# Patient Record
Sex: Female | Born: 1980 | Race: White | Hispanic: No | Marital: Married | Smoking: Never smoker
Health system: Southern US, Community
[De-identification: ages and names within clinical notes are randomized; demographics above are authoritative.]

---

## 2009-08-03 ENCOUNTER — Ambulatory Visit (HOSPITAL_COMMUNITY): Admission: AD | Admit: 2009-08-03 | Discharge: 2009-08-03 | Payer: Self-pay | Admitting: Obstetrics and Gynecology

## 2009-08-26 ENCOUNTER — Inpatient Hospital Stay (HOSPITAL_COMMUNITY): Admission: AD | Admit: 2009-08-26 | Discharge: 2009-08-27 | Payer: Self-pay | Admitting: Obstetrics and Gynecology

## 2009-10-30 ENCOUNTER — Inpatient Hospital Stay (HOSPITAL_COMMUNITY): Admission: AD | Admit: 2009-10-30 | Discharge: 2009-10-30 | Payer: Self-pay | Admitting: Obstetrics and Gynecology

## 2009-11-03 ENCOUNTER — Inpatient Hospital Stay (HOSPITAL_COMMUNITY): Admission: AD | Admit: 2009-11-03 | Discharge: 2009-11-07 | Payer: Self-pay | Admitting: Obstetrics and Gynecology

## 2010-12-03 LAB — CBC
HCT: 22.9 % — ABNORMAL LOW (ref 36.0–46.0)
HCT: 35.7 % — ABNORMAL LOW (ref 36.0–46.0)
Hemoglobin: 11.8 g/dL — ABNORMAL LOW (ref 12.0–15.0)
Hemoglobin: 7.6 g/dL — ABNORMAL LOW (ref 12.0–15.0)
MCHC: 33.1 g/dL (ref 30.0–36.0)
MCHC: 33.4 g/dL (ref 30.0–36.0)
MCV: 82.9 fL (ref 78.0–100.0)
MCV: 82.9 fL (ref 78.0–100.0)
RBC: 4.31 MIL/uL (ref 3.87–5.11)
RDW: 12.7 % (ref 11.5–15.5)
WBC: 12.2 10*3/uL — ABNORMAL HIGH (ref 4.0–10.5)

## 2010-12-03 LAB — RH IMMUNE GLOB WKUP(>/=20WKS)(NOT WOMEN'S HOSP)

## 2010-12-15 LAB — WET PREP, GENITAL: Yeast Wet Prep HPF POC: NONE SEEN

## 2010-12-15 LAB — URINALYSIS, ROUTINE W REFLEX MICROSCOPIC
Ketones, ur: NEGATIVE mg/dL
Nitrite: NEGATIVE
Specific Gravity, Urine: 1.02 (ref 1.005–1.030)
pH: 6.5 (ref 5.0–8.0)

## 2010-12-15 LAB — URINE MICROSCOPIC-ADD ON

## 2010-12-15 LAB — URINE CULTURE

## 2010-12-15 LAB — FETAL FIBRONECTIN: Fetal Fibronectin: NEGATIVE

## 2010-12-16 LAB — GLUCOSE TOLERANCE, 1 HOUR: Glucose, 1 Hour GTT: 83 mg/dL (ref 70–140)

## 2010-12-16 LAB — RH IMMUNE GLOBULIN WORKUP (NOT WOMEN'S HOSP)

## 2010-12-16 LAB — RPR: RPR Ser Ql: NONREACTIVE

## 2013-12-20 LAB — OB RESULTS CONSOLE ANTIBODY SCREEN: Antibody Screen: NEGATIVE

## 2013-12-20 LAB — OB RESULTS CONSOLE HIV ANTIBODY (ROUTINE TESTING): HIV: NONREACTIVE

## 2013-12-20 LAB — OB RESULTS CONSOLE ABO/RH: RH TYPE: NEGATIVE

## 2013-12-20 LAB — OB RESULTS CONSOLE RUBELLA ANTIBODY, IGM: Rubella: IMMUNE

## 2013-12-20 LAB — OB RESULTS CONSOLE HEPATITIS B SURFACE ANTIGEN: Hepatitis B Surface Ag: NEGATIVE

## 2013-12-20 LAB — OB RESULTS CONSOLE RPR: RPR: NONREACTIVE

## 2013-12-20 LAB — OB RESULTS CONSOLE GC/CHLAMYDIA
CHLAMYDIA, DNA PROBE: NEGATIVE
Gonorrhea: NEGATIVE

## 2014-04-04 ENCOUNTER — Inpatient Hospital Stay (HOSPITAL_COMMUNITY): Admission: AD | Admit: 2014-04-04 | Payer: Self-pay | Source: Ambulatory Visit | Admitting: Obstetrics and Gynecology

## 2014-07-26 LAB — OB RESULTS CONSOLE GBS: STREP GROUP B AG: NEGATIVE

## 2014-08-27 ENCOUNTER — Encounter (HOSPITAL_COMMUNITY): Payer: Self-pay | Admitting: *Deleted

## 2014-08-27 ENCOUNTER — Inpatient Hospital Stay (HOSPITAL_COMMUNITY): Payer: BC Managed Care – PPO | Admitting: Anesthesiology

## 2014-08-27 ENCOUNTER — Encounter (HOSPITAL_COMMUNITY)
Admission: AD | Disposition: A | Discharge status: Home / Self Care | Payer: Self-pay | Source: Ambulatory Visit | Attending: Obstetrics and Gynecology

## 2014-08-27 ENCOUNTER — Inpatient Hospital Stay (HOSPITAL_COMMUNITY)
Admission: AD | Admit: 2014-08-27 | Discharge: 2014-08-29 | DRG: 766 | Disposition: A | Discharge status: Home / Self Care | Payer: BC Managed Care – PPO | Source: Ambulatory Visit | Attending: Obstetrics and Gynecology | Admitting: Obstetrics and Gynecology

## 2014-08-27 DIAGNOSIS — O26899 Other specified pregnancy related conditions, unspecified trimester: Secondary | ICD-10-CM

## 2014-08-27 DIAGNOSIS — Z3A41 41 weeks gestation of pregnancy: Secondary | ICD-10-CM | POA: Diagnosis present

## 2014-08-27 DIAGNOSIS — Z3483 Encounter for supervision of other normal pregnancy, third trimester: Secondary | ICD-10-CM | POA: Diagnosis present

## 2014-08-27 DIAGNOSIS — D649 Anemia, unspecified: Secondary | ICD-10-CM | POA: Diagnosis present

## 2014-08-27 DIAGNOSIS — O9902 Anemia complicating childbirth: Secondary | ICD-10-CM | POA: Diagnosis present

## 2014-08-27 DIAGNOSIS — O3660X0 Maternal care for excessive fetal growth, unspecified trimester, not applicable or unspecified: Secondary | ICD-10-CM

## 2014-08-27 DIAGNOSIS — Z98891 History of uterine scar from previous surgery: Secondary | ICD-10-CM

## 2014-08-27 DIAGNOSIS — O480 Post-term pregnancy: Secondary | ICD-10-CM | POA: Diagnosis present

## 2014-08-27 DIAGNOSIS — O3663X0 Maternal care for excessive fetal growth, third trimester, not applicable or unspecified: Secondary | ICD-10-CM | POA: Diagnosis present

## 2014-08-27 DIAGNOSIS — Z6791 Unspecified blood type, Rh negative: Secondary | ICD-10-CM

## 2014-08-27 DIAGNOSIS — O3421 Maternal care for scar from previous cesarean delivery: Secondary | ICD-10-CM | POA: Diagnosis present

## 2014-08-27 DIAGNOSIS — F411 Generalized anxiety disorder: Secondary | ICD-10-CM

## 2014-08-27 LAB — RPR

## 2014-08-27 LAB — CBC
HCT: 37.6 % (ref 36.0–46.0)
Hemoglobin: 12.9 g/dL (ref 12.0–15.0)
MCH: 28.7 pg (ref 26.0–34.0)
MCHC: 34.3 g/dL (ref 30.0–36.0)
MCV: 83.7 fL (ref 78.0–100.0)
PLATELETS: 148 10*3/uL — AB (ref 150–400)
RBC: 4.49 MIL/uL (ref 3.87–5.11)
RDW: 14.2 % (ref 11.5–15.5)
WBC: 11.3 10*3/uL — ABNORMAL HIGH (ref 4.0–10.5)

## 2014-08-27 SURGERY — Surgical Case
Anesthesia: Epidural | Site: Abdomen

## 2014-08-27 MED ORDER — KETOROLAC TROMETHAMINE 30 MG/ML IJ SOLN: 30.0000 mg | Freq: Four times a day (QID) | INTRAMUSCULAR | Status: AC | PRN

## 2014-08-27 MED ORDER — WITCH HAZEL-GLYCERIN EX PADS: 1.0000 "application " | MEDICATED_PAD | CUTANEOUS | Status: DC | PRN

## 2014-08-27 MED ORDER — DIBUCAINE 1 % RE OINT: 1.0000 "application " | TOPICAL_OINTMENT | RECTAL | Status: DC | PRN

## 2014-08-27 MED ORDER — PHENYLEPHRINE 40 MCG/ML (10ML) SYRINGE FOR IV PUSH (FOR BLOOD PRESSURE SUPPORT): 80.0000 ug | PREFILLED_SYRINGE | INTRAVENOUS | Status: DC | PRN

## 2014-08-27 MED ORDER — MEPERIDINE HCL 25 MG/ML IJ SOLN: 6.2500 mg | INTRAMUSCULAR | Status: DC | PRN

## 2014-08-27 MED ORDER — OXYCODONE-ACETAMINOPHEN 5-325 MG PO TABS: 2.0000 | ORAL_TABLET | ORAL | Status: DC | PRN

## 2014-08-27 MED ORDER — NALOXONE HCL 1 MG/ML IJ SOLN: 1.0000 ug/kg/h | INTRAVENOUS | Status: DC | PRN

## 2014-08-27 MED ORDER — SODIUM BICARBONATE 8.4 % IV SOLN
INTRAVENOUS | Status: DC | PRN
  Administered 2014-08-27 (×4): 5 mL via EPIDURAL

## 2014-08-27 MED ORDER — EPHEDRINE 5 MG/ML INJ: 10.0000 mg | INTRAVENOUS | Status: DC | PRN

## 2014-08-27 MED ORDER — PRENATAL MULTIVITAMIN CH
1.0000 | ORAL_TABLET | Freq: Every day | ORAL | Status: DC
  Administered 2014-08-28 – 2014-08-29 (×2): 1 via ORAL
  Filled 2014-08-27 (×2): qty 1

## 2014-08-27 MED ORDER — FERROUS SULFATE 325 (65 FE) MG PO TABS
325.0000 mg | ORAL_TABLET | Freq: Two times a day (BID) | ORAL | Status: DC
  Administered 2014-08-28 – 2014-08-29 (×3): 325 mg via ORAL
  Filled 2014-08-27 (×4): qty 1

## 2014-08-27 MED ORDER — OXYTOCIN 40 UNITS IN LACTATED RINGERS INFUSION - SIMPLE MED: 62.5000 mL/h | INTRAVENOUS | Status: DC

## 2014-08-27 MED ORDER — PHENYLEPHRINE 40 MCG/ML (10ML) SYRINGE FOR IV PUSH (FOR BLOOD PRESSURE SUPPORT)
80.0000 ug | PREFILLED_SYRINGE | INTRAVENOUS | Status: DC | PRN
  Filled 2014-08-27: qty 10

## 2014-08-27 MED ORDER — LACTATED RINGERS IV SOLN
INTRAVENOUS | Status: DC
  Administered 2014-08-27: 23:00:00 via INTRAVENOUS

## 2014-08-27 MED ORDER — ONDANSETRON HCL 4 MG/2ML IJ SOLN
4.0000 mg | Freq: Three times a day (TID) | INTRAMUSCULAR | Status: DC | PRN
  Administered 2014-08-27: 4 mg via INTRAVENOUS

## 2014-08-27 MED ORDER — IBUPROFEN 600 MG PO TABS
600.0000 mg | ORAL_TABLET | Freq: Four times a day (QID) | ORAL | Status: DC
  Administered 2014-08-27 – 2014-08-29 (×7): 600 mg via ORAL
  Filled 2014-08-27 (×7): qty 1

## 2014-08-27 MED ORDER — SCOPOLAMINE 1 MG/3DAYS TD PT72: 1.0000 | MEDICATED_PATCH | Freq: Once | TRANSDERMAL | Status: DC

## 2014-08-27 MED ORDER — METOCLOPRAMIDE HCL 5 MG/ML IJ SOLN
INTRAMUSCULAR | Status: DC | PRN
  Administered 2014-08-27 (×2): 5 mg via INTRAVENOUS

## 2014-08-27 MED ORDER — FENTANYL CITRATE 0.05 MG/ML IJ SOLN: 25.0000 ug | INTRAMUSCULAR | Status: DC | PRN

## 2014-08-27 MED ORDER — NALBUPHINE HCL 10 MG/ML IJ SOLN: 5.0000 mg | INTRAMUSCULAR | Status: DC | PRN

## 2014-08-27 MED ORDER — CEFAZOLIN SODIUM-DEXTROSE 2-3 GM-% IV SOLR
2.0000 g | Freq: Once | INTRAVENOUS | Status: AC
  Administered 2014-08-27: 2 g via INTRAVENOUS
  Filled 2014-08-27: qty 50

## 2014-08-27 MED ORDER — PHENYLEPHRINE 8 MG IN D5W 100 ML (0.08MG/ML) PREMIX OPTIME
INJECTION | INTRAVENOUS | Status: DC | PRN
  Administered 2014-08-27: 60 ug/min via INTRAVENOUS

## 2014-08-27 MED ORDER — MENTHOL 3 MG MT LOZG: 1.0000 | LOZENGE | OROMUCOSAL | Status: DC | PRN

## 2014-08-27 MED ORDER — LIDOCAINE-EPINEPHRINE (PF) 2 %-1:200000 IJ SOLN
INTRAMUSCULAR | Status: AC
  Filled 2014-08-27: qty 20

## 2014-08-27 MED ORDER — SODIUM BICARBONATE 8.4 % IV SOLN
INTRAVENOUS | Status: AC
  Filled 2014-08-27: qty 50

## 2014-08-27 MED ORDER — ONDANSETRON HCL 4 MG/2ML IJ SOLN
INTRAMUSCULAR | Status: DC | PRN
  Administered 2014-08-27: 4 mg via INTRAVENOUS

## 2014-08-27 MED ORDER — NALBUPHINE HCL 10 MG/ML IJ SOLN: 5.0000 mg | Freq: Once | INTRAMUSCULAR | Status: AC | PRN

## 2014-08-27 MED ORDER — ZOLPIDEM TARTRATE 5 MG PO TABS: 5.0000 mg | ORAL_TABLET | Freq: Every evening | ORAL | Status: DC | PRN

## 2014-08-27 MED ORDER — MEASLES, MUMPS & RUBELLA VAC ~~LOC~~ INJ: 0.5000 mL | INJECTION | Freq: Once | SUBCUTANEOUS | Status: DC

## 2014-08-27 MED ORDER — SENNOSIDES-DOCUSATE SODIUM 8.6-50 MG PO TABS
2.0000 | ORAL_TABLET | ORAL | Status: DC
  Administered 2014-08-27 – 2014-08-29 (×2): 2 via ORAL
  Filled 2014-08-27 (×2): qty 2

## 2014-08-27 MED ORDER — OXYTOCIN 40 UNITS IN LACTATED RINGERS INFUSION - SIMPLE MED: 62.5000 mL/h | INTRAVENOUS | Status: AC

## 2014-08-27 MED ORDER — LIDOCAINE HCL (PF) 1 % IJ SOLN: 30.0000 mL | INTRAMUSCULAR | Status: DC | PRN

## 2014-08-27 MED ORDER — FENTANYL 2.5 MCG/ML BUPIVACAINE 1/10 % EPIDURAL INFUSION (WH - ANES)
INTRAMUSCULAR | Status: DC | PRN
  Administered 2014-08-27: 15 mL/h via EPIDURAL

## 2014-08-27 MED ORDER — LIDOCAINE HCL (PF) 1 % IJ SOLN
INTRAMUSCULAR | Status: DC | PRN
  Administered 2014-08-27: 5 mL
  Administered 2014-08-27: 4 mL

## 2014-08-27 MED ORDER — PROMETHAZINE HCL 25 MG/ML IJ SOLN
12.5000 mg | Freq: Three times a day (TID) | INTRAMUSCULAR | Status: DC | PRN
  Administered 2014-08-27: 12.5 mg via INTRAVENOUS
  Filled 2014-08-27: qty 1

## 2014-08-27 MED ORDER — NALOXONE HCL 0.4 MG/ML IJ SOLN: 0.4000 mg | INTRAMUSCULAR | Status: DC | PRN

## 2014-08-27 MED ORDER — METHYLERGONOVINE MALEATE 0.2 MG PO TABS: 0.2000 mg | ORAL_TABLET | ORAL | Status: DC | PRN

## 2014-08-27 MED ORDER — OXYTOCIN 40 UNITS IN LACTATED RINGERS INFUSION - SIMPLE MED
INTRAVENOUS | Status: AC
  Filled 2014-08-27: qty 1000

## 2014-08-27 MED ORDER — SCOPOLAMINE 1 MG/3DAYS TD PT72
MEDICATED_PATCH | TRANSDERMAL | Status: DC | PRN
  Administered 2014-08-27: 1 via TRANSDERMAL

## 2014-08-27 MED ORDER — OXYCODONE-ACETAMINOPHEN 5-325 MG PO TABS
1.0000 | ORAL_TABLET | ORAL | Status: DC | PRN
Start: 2014-08-27 — End: 2014-08-29

## 2014-08-27 MED ORDER — SODIUM CHLORIDE 0.9 % IJ SOLN: 3.0000 mL | INTRAMUSCULAR | Status: DC | PRN

## 2014-08-27 MED ORDER — ONDANSETRON HCL 4 MG/2ML IJ SOLN
4.0000 mg | INTRAMUSCULAR | Status: DC | PRN
  Filled 2014-08-27: qty 2

## 2014-08-27 MED ORDER — DIPHENHYDRAMINE HCL 50 MG/ML IJ SOLN: 12.5000 mg | INTRAMUSCULAR | Status: DC | PRN

## 2014-08-27 MED ORDER — BUPIVACAINE HCL (PF) 0.25 % IJ SOLN
INTRAMUSCULAR | Status: AC
Start: 2014-08-27 — End: 2014-08-27
  Filled 2014-08-27: qty 10

## 2014-08-27 MED ORDER — OXYTOCIN 10 UNIT/ML IJ SOLN
40.0000 [IU] | INTRAVENOUS | Status: DC | PRN
  Administered 2014-08-27: 40 [IU] via INTRAVENOUS

## 2014-08-27 MED ORDER — LANOLIN HYDROUS EX OINT: 1.0000 "application " | TOPICAL_OINTMENT | CUTANEOUS | Status: DC | PRN

## 2014-08-27 MED ORDER — ONDANSETRON HCL 4 MG/2ML IJ SOLN: 4.0000 mg | Freq: Four times a day (QID) | INTRAMUSCULAR | Status: DC | PRN

## 2014-08-27 MED ORDER — TETANUS-DIPHTH-ACELL PERTUSSIS 5-2.5-18.5 LF-MCG/0.5 IM SUSP: 0.5000 mL | Freq: Once | INTRAMUSCULAR | Status: DC

## 2014-08-27 MED ORDER — LIDOCAINE HCL (PF) 1 % IJ SOLN
INTRAMUSCULAR | Status: AC
  Filled 2014-08-27: qty 30

## 2014-08-27 MED ORDER — BUPIVACAINE HCL (PF) 0.25 % IJ SOLN
INTRAMUSCULAR | Status: AC
  Filled 2014-08-27: qty 30

## 2014-08-27 MED ORDER — OXYCODONE-ACETAMINOPHEN 5-325 MG PO TABS: 1.0000 | ORAL_TABLET | ORAL | Status: DC | PRN

## 2014-08-27 MED ORDER — ONDANSETRON HCL 4 MG PO TABS: 4.0000 mg | ORAL_TABLET | ORAL | Status: DC | PRN

## 2014-08-27 MED ORDER — MORPHINE SULFATE (PF) 0.5 MG/ML IJ SOLN
INTRAMUSCULAR | Status: DC | PRN
  Administered 2014-08-27: 4 mg via EPIDURAL
  Administered 2014-08-27: 1 mg via INTRAVENOUS

## 2014-08-27 MED ORDER — LACTATED RINGERS IV SOLN: 500.0000 mL | INTRAVENOUS | Status: DC | PRN

## 2014-08-27 MED ORDER — ACETAMINOPHEN 325 MG PO TABS: 650.0000 mg | ORAL_TABLET | ORAL | Status: DC | PRN

## 2014-08-27 MED ORDER — SIMETHICONE 80 MG PO CHEW
80.0000 mg | CHEWABLE_TABLET | ORAL | Status: DC
  Administered 2014-08-27 – 2014-08-29 (×2): 80 mg via ORAL
  Filled 2014-08-27 (×2): qty 1

## 2014-08-27 MED ORDER — LACTATED RINGERS IV SOLN
500.0000 mL | Freq: Once | INTRAVENOUS | Status: AC
  Administered 2014-08-27: 500 mL via INTRAVENOUS

## 2014-08-27 MED ORDER — CITRIC ACID-SODIUM CITRATE 334-500 MG/5ML PO SOLN
30.0000 mL | ORAL | Status: DC | PRN
  Administered 2014-08-27: 30 mL via ORAL
  Filled 2014-08-27: qty 15

## 2014-08-27 MED ORDER — KETOROLAC TROMETHAMINE 30 MG/ML IJ SOLN: 15.0000 mg | Freq: Once | INTRAMUSCULAR | Status: DC | PRN

## 2014-08-27 MED ORDER — OXYTOCIN BOLUS FROM INFUSION: 500.0000 mL | INTRAVENOUS | Status: DC

## 2014-08-27 MED ORDER — DIPHENHYDRAMINE HCL 25 MG PO CAPS
25.0000 mg | ORAL_CAPSULE | ORAL | Status: DC | PRN
  Filled 2014-08-27: qty 1

## 2014-08-27 MED ORDER — DIPHENHYDRAMINE HCL 25 MG PO CAPS: 25.0000 mg | ORAL_CAPSULE | Freq: Four times a day (QID) | ORAL | Status: DC | PRN

## 2014-08-27 MED ORDER — METHYLERGONOVINE MALEATE 0.2 MG/ML IJ SOLN: 0.2000 mg | INTRAMUSCULAR | Status: DC | PRN

## 2014-08-27 MED ORDER — SIMETHICONE 80 MG PO CHEW
80.0000 mg | CHEWABLE_TABLET | ORAL | Status: DC | PRN
  Administered 2014-08-28: 80 mg via ORAL

## 2014-08-27 MED ORDER — FENTANYL 2.5 MCG/ML BUPIVACAINE 1/10 % EPIDURAL INFUSION (WH - ANES)
14.0000 mL/h | INTRAMUSCULAR | Status: DC | PRN
  Filled 2014-08-27: qty 125

## 2014-08-27 MED ORDER — FLEET ENEMA 7-19 GM/118ML RE ENEM: 1.0000 | ENEMA | RECTAL | Status: DC | PRN

## 2014-08-27 MED ORDER — OXYTOCIN 10 UNIT/ML IJ SOLN
INTRAMUSCULAR | Status: AC
  Filled 2014-08-27: qty 1

## 2014-08-27 MED ORDER — LACTATED RINGERS IV SOLN
INTRAVENOUS | Status: DC | PRN
  Administered 2014-08-27 (×2): via INTRAVENOUS

## 2014-08-27 MED ORDER — LACTATED RINGERS IV BOLUS (SEPSIS)
500.0000 mL | Freq: Once | INTRAVENOUS | Status: AC
  Administered 2014-08-27: 500 mL via INTRAVENOUS

## 2014-08-27 MED ORDER — LACTATED RINGERS IV SOLN
INTRAVENOUS | Status: DC
Start: 2014-08-27 — End: 2014-08-27
  Administered 2014-08-27: 12:00:00 via INTRAVENOUS

## 2014-08-27 MED ORDER — OXYTOCIN 10 UNIT/ML IJ SOLN
INTRAMUSCULAR | Status: AC
  Filled 2014-08-27: qty 4

## 2014-08-27 MED ORDER — CEFAZOLIN SODIUM 1-5 GM-% IV SOLN: 1.0000 g | Freq: Once | INTRAVENOUS | Status: DC

## 2014-08-27 MED ORDER — 0.9 % SODIUM CHLORIDE (POUR BTL) OPTIME
TOPICAL | Status: DC | PRN
  Administered 2014-08-27: 1000 mL

## 2014-08-27 MED ORDER — SIMETHICONE 80 MG PO CHEW
80.0000 mg | CHEWABLE_TABLET | Freq: Three times a day (TID) | ORAL | Status: DC
  Administered 2014-08-28 – 2014-08-29 (×3): 80 mg via ORAL
  Filled 2014-08-27 (×4): qty 1

## 2014-08-27 MED ORDER — LACTATED RINGERS IV SOLN
INTRAVENOUS | Status: DC | PRN
  Administered 2014-08-27: 14:00:00 via INTRAVENOUS

## 2014-08-27 MED ORDER — ONDANSETRON HCL 4 MG/2ML IJ SOLN
INTRAMUSCULAR | Status: AC
  Filled 2014-08-27: qty 2

## 2014-08-27 MED ORDER — MORPHINE SULFATE 0.5 MG/ML IJ SOLN
INTRAMUSCULAR | Status: AC
  Filled 2014-08-27: qty 10

## 2014-08-27 MED ORDER — PROMETHAZINE HCL 25 MG/ML IJ SOLN: 6.2500 mg | INTRAMUSCULAR | Status: DC | PRN

## 2014-08-27 MED ORDER — BUPIVACAINE HCL (PF) 0.25 % IJ SOLN
INTRAMUSCULAR | Status: DC | PRN
  Administered 2014-08-27: 20 mL

## 2014-08-27 SURGICAL SUPPLY — 38 items
BENZOIN TINCTURE PRP APPL 2/3 (GAUZE/BANDAGES/DRESSINGS) ×3 IMPLANT
BOOTIES KNEE HIGH SLOAN (MISCELLANEOUS) ×6 IMPLANT
CLAMP CORD UMBIL (MISCELLANEOUS) IMPLANT
CLOSURE WOUND 1/2 X4 (GAUZE/BANDAGES/DRESSINGS) ×1
CLOTH BEACON ORANGE TIMEOUT ST (SAFETY) ×3 IMPLANT
DRAIN JACKSON PRT FLT 10 (DRAIN) IMPLANT
DRAPE SHEET LG 3/4 BI-LAMINATE (DRAPES) IMPLANT
DRSG OPSITE POSTOP 4X10 (GAUZE/BANDAGES/DRESSINGS) ×3 IMPLANT
DURAPREP 26ML APPLICATOR (WOUND CARE) ×3 IMPLANT
ELECT REM PT RETURN 9FT ADLT (ELECTROSURGICAL) ×3
ELECTRODE REM PT RTRN 9FT ADLT (ELECTROSURGICAL) ×1 IMPLANT
EVACUATOR SILICONE 100CC (DRAIN) IMPLANT
EXTRACTOR VACUUM M CUP 4 TUBE (SUCTIONS) IMPLANT
EXTRACTOR VACUUM M CUP 4' TUBE (SUCTIONS)
GLOVE BIOGEL PI IND STRL 7.0 (GLOVE) ×1 IMPLANT
GLOVE BIOGEL PI INDICATOR 7.0 (GLOVE) ×2
GLOVE ECLIPSE 6.5 STRL STRAW (GLOVE) ×3 IMPLANT
GOWN STRL REUS W/TWL LRG LVL3 (GOWN DISPOSABLE) ×6 IMPLANT
KIT ABG SYR 3ML LUER SLIP (SYRINGE) IMPLANT
NEEDLE HYPO 22GX1.5 SAFETY (NEEDLE) ×3 IMPLANT
NEEDLE HYPO 25X5/8 SAFETYGLIDE (NEEDLE) IMPLANT
NS IRRIG 1000ML POUR BTL (IV SOLUTION) ×6 IMPLANT
PACK C SECTION WH (CUSTOM PROCEDURE TRAY) ×3 IMPLANT
PAD ABD 8X7 1/2 STERILE (GAUZE/BANDAGES/DRESSINGS) ×6 IMPLANT
PAD OB MATERNITY 4.3X12.25 (PERSONAL CARE ITEMS) ×3 IMPLANT
RTRCTR C-SECT PINK 25CM LRG (MISCELLANEOUS) ×6 IMPLANT
SPONGE LAP 18X18 X RAY DECT (DISPOSABLE) ×3 IMPLANT
STRIP CLOSURE SKIN 1/2X4 (GAUZE/BANDAGES/DRESSINGS) ×2 IMPLANT
SUT CHROMIC GUT AB #0 18 (SUTURE) IMPLANT
SUT MNCRL AB 3-0 PS2 27 (SUTURE) ×3 IMPLANT
SUT SILK 2 0 FSL 18 (SUTURE) IMPLANT
SUT VIC AB 0 CTX 36 (SUTURE) ×8
SUT VIC AB 0 CTX36XBRD ANBCTRL (SUTURE) ×4 IMPLANT
SUT VIC AB 1 CT1 36 (SUTURE) ×6 IMPLANT
SYR 20CC LL (SYRINGE) ×3 IMPLANT
TOWEL OR 17X24 6PK STRL BLUE (TOWEL DISPOSABLE) ×3 IMPLANT
TRAY FOLEY CATH 14FR (SET/KITS/TRAYS/PACK) ×3 IMPLANT
WATER STERILE IRR 1000ML POUR (IV SOLUTION) ×3 IMPLANT

## 2014-08-27 NOTE — Progress Notes (Signed)
Alain MarionRebecca H Gonia MRN: [id number]  Subjective: -In to assess.  Patient actively pushing, with push bar and towel, and making good maternal efforts.  Fetal position remains at 0,+1 station with extension of caput noted.   Objective: BP 90/60 mmHg  Pulse 132  Temp(Src) 97.8 F (36.6 C) (Axillary)  Resp 20  Ht 5' 10.5" (1.791 m)  Wt 224 lb (101.606 kg)  BMI 31.68 kg/m2  SpO2 100%     FHT: 155 bpm, Mod Var, + Variable Decels, +Accels UC:  Q1-493min SVE:   Dilation: 10 Station: 0, +1 Exam by:: Dalynn Jhaveri Membranes: SROM Pitocin:None  Assessment:  IUP at 41.2wks Cat II FT  TOLAC Second Stage Labor Inadequate Fetal Descent  Plan: -Cat II FT resolved with resolution of pushing efforts -Discussed SE findings with regards to increased caput and lack of fetal descent -Recommend repeat cesarean section when assess lack of labor progress in second stage -Patient verbalizes understanding, but would like time to think and discuss with husband -Dr. Kathie RhodesS. Rivard updated on patient status including arrest of descent and recommendation for c/s--Agrees with recommendation -Continue other mgmt as ordered   [facility], Yusif Gnau LYNN,MSN, CNM 08/27/2014, 12:22 PM

## 2014-08-27 NOTE — Addendum Note (Signed)
Addendum  created 08/27/14 1952 by Tyrone AppleMichael A. Malen GauzeFoster, MD   Modules edited: Orders

## 2014-08-27 NOTE — H&P (Signed)
Kari Baker is a 33 y.o. female, [phone] presenting for regular contractions, every 2 min, since midnight. Bernette RedbirdKenny, doula, here with pt. Denies LOF or VB. Reports active fetus. Pt is a previous c-section x 2 who desires VBAC. VBAC consent signed on 06/17/14 @ 31.1 wks in office.  Pt entered care at [facility] at 7.4 wks.  EDC 08/18/14 by sure LMP and that was c/w a 7.2 wk scan.  Reports h/o severe vomiting with narcotics.   Maternal Medical History:  Reason for admission: Contractions.   Contractions: Onset was 3-5 hours ago.   Frequency: regular.   Duration is approximately 60 seconds.   Perceived severity is strong.    Fetal activity: Perceived fetal activity is normal.   Last perceived fetal movement was within the past hour.    Prenatal complications: No bleeding or PIH.     OB History    Gravida Para Term Preterm AB TAB SAB Ectopic Multiple Living   1              11/05/2009 @ 40 wks, admitted due to PROM. C-section due to CPD and failed VE, birthwt 9lbs 6 oz; [facility]; 2 layer closure 08/05/2011 @ 4 wks, [place] O[facility]um 04/24/2012 @ 36 wks, C-section due to Previa, birthwt 6-13; [place]; 2 layer closure  History reviewed. No pertinent past medical history. History reviewed. No pertinent past surgical history. Family History: family history is not on file. Social History:  reports that she has never smoked. She does not have any smokeless tobacco history on file. Her alcohol and drug histories are not on file.Pt is a Caucasian female with a 4 yr college degree, of the Catholic faith, and works as a Futures traderhomemaker. Pt is accompanied by Bernette RedbirdKenny, doula. FOB, Fraser Dinreston, also present.   Received Tdap  Declined flu vaccine  Prenatal Transfer Tool  Maternal Diabetes: No Genetic Screening: Declined Maternal Ultrasounds/Referrals: Normal  Fetal Ultrasounds or other Referrals:  None Maternal Substance Abuse:  No Significant Maternal Medications:  Meds include: Other:  PNVs Significant Maternal Lab Results:  Lab values include: Group B Strep negative, Rh negative Other Comments:  Previous c-section x 2. Anatomy scan at 18.2 wks, anterior placenta, no previa, incomplete anatomy. Anatomy completed at 24 wks. Fetus noted to be at the 97th%tile @ 35.2 wks (7 lbs 6 oz).   Review of Systems  Eyes: Negative for blurred vision and photophobia.  Gastrointestinal: Negative for vomiting and abdominal pain.  Neurological: Negative for headaches.    Dilation: 10 Station: 0, +1 Exam by:: Emly Blood pressure 127/78, pulse 130, temperature 97.8 F (36.6 C), temperature source Axillary, resp. rate 20, height 5' 10.5" (1.791 m), weight 224 lb (101.606 kg), SpO2 100 %.   SROM at 0546, clear fluid  Maternal Exam:  Uterine Assessment: Contraction strength is firm.  Contraction duration is 60 seconds. Contraction frequency is regular.   Abdomen: Patient reports no abdominal tenderness. Surgical scars: low transverse.   Fundal height is 42 cm.   Fetal presentation: vertex  Introitus: Normal vulva. Normal vagina.    Fetal Exam Fetal Monitor Review: Mode: fetoscope.   Baseline rate: 140.  Variability: moderate (6-25 bpm).   Pattern: accelerations present and no decelerations.    Fetal State Assessment: Category I - tracings are normal.    EFW 10 lbs  Physical Exam  Constitutional: She is oriented to person, place, and time. She appears well-developed and well-nourished.  HENT:  Head: Normocephalic and atraumatic.  Cardiovascular: Normal rate,  regular rhythm and normal heart sounds.   No murmur heard. Respiratory: Effort normal and breath sounds normal.  GI: Soft. Bowel sounds are normal.  Musculoskeletal: Normal range of motion.  Neurological: She is alert and oriented to person, place, and time. She has normal reflexes.  Skin: Skin is warm and dry.    Prenatal labs: ABO, Rh: --/--/O NEG (12/15 0815)Received Rhogam on 06/04/14 Antibody: POS (12/15  0815) Rubella: Immune (04/09 0000) RPR: NON REAC (12/15 0600)  HBsAg: Negative (04/09 0000)  HIV: Non-reactive (04/09 0000)  GBS: Negative (11/13 0000)  GC/CT: Neg on 12/20/13 NOB Hbg 13.7, 12.6 at 28 wks Glucola: normal at 94; spot UA in office neg for glucose Last pap 01/22/14; normal  Urine culture positive for E.coli (<100K) in June and Sep, but pt declined treatment due to no symptoms.   Assessment/Plan: IUP at 41.2 wks. Previous c-s x 2; desires VBAC; consent obtained on admission. Consent signed in office on 06/17/14. Cat 1 FHRT. SROM. Suspect macrosomia. Rh neg. GBS neg. Pt fully understands that due to previous c-section x 2, she will have continuous monitoring and saline lock. Expectant management for now. Frequent position changes. Discussed with Dr. Normand Sloopillard. Plan as above. Offer epidural. Place IUPC. Monitor FHRT closely.  Consult as indicated. Expect progress.  Sherre ScarletWILLIAMS, Sabena Winner 08/27/2014, 10:58 AM

## 2014-08-27 NOTE — Op Note (Signed)
Preoperative diagnosis: Intrauterine pregnancy at 41 weeks and 2 days, 2 previous cesarean section, failure to descend  Post operative diagnosis: Same  Anesthesia: Epidural  Anesthesiologist: Dr. Rodman Pickleassidy  Procedure: Repeat low transverse cesarean section  Surgeon: Dr. Dois DavenportSandra Brodan Grewell  Assistant: Gerrit HeckJessica Emly CNM  Estimated blood loss: 700 cc  Procedure:  After being informed of the planned procedure and possible complications including bleeding, infection, injury to other organs, informed consent is obtained. The patient is taken to OR #9 and pre-existing epidural was reinforced without complication. She is placed in the dorsal decubitus position with the pelvis tilted to the left. She is then prepped and draped in a sterile fashion. A Foley catheter is inserted in her bladder.  After assessing adequate level of anesthesia, we infiltrate the suprapubic area with 20 cc of Marcaine 0.25 and perform a Pfannenstiel incision which is brought down sharply to the fascia. The fascia is entered in a low transverse fashion. Linea alba is dissected. Peritoneum is entered in a midline fashion. An Alexis retractor is easily positioned.   The myometrium is then entered in a low transverse fashion, 2 cm above the vesico-uterine junction ; first with knife and then extended bluntly. Amniotic fluid is clear. We assist the birth of a female  infant in vertex presentation. Mouth and nose are suctioned. The baby is delivered. The cord is clamped and sectioned. The baby is given to the neonatologist present in the room.  10 cc of blood is drawn from the umbilical vein.The placenta is allowed to deliver spontaneously. It is complete and the cord has 3 vessels. Uterine revision is negative.  We proceed with closure of the myometrium in 2 layers: First with a running locked suture of 0 Vicryl, then with a Lembert suture of 0 Vicryl imbricating the first one. Hemostasis is completed with cauterization on peritoneal  edges and multiple figure-of-eight stitches of 0-Vicryl  Both paracolic gutters are cleaned. Both tubes and ovaries are assessed and normal. The pelvis is profusely irrigated with warm saline to confirm a satisfactory hemostasis.  Retractors and sponges are removed. Under fascia hemostasis is completed with cauterization. The fascia is then closed with 2 running sutures of 0 Vicryl meeting midline. The wound is irrigated with warm saline and hemostasis is completed with cauterization. The skin is closed with a subcuticular suture of 3-0 Monocryl and Steri-Strips.  Instrument and sponge count is complete x2. Estimated blood loss is 700 cc.  The procedure is well tolerated by the patient who is taken to recovery room in a well and stable condition.  female baby  was born at 14:09 and received an Apgar of 8  at 1 minute and 9 at 5 minutes.    Specimen: Placenta sent to L & D   Moyinoluwa Dawe A MD 12/15/20152:58 PM

## 2014-08-27 NOTE — MAU Note (Signed)
PT ARRIVED - HOLLERING  WITH UC IN LOBBY.    TO RM 6 -  FHR- 144-    VE   8 CM , 100%   VTX-    INTACT.    TO RM 166 VIA W/C

## 2014-08-27 NOTE — Anesthesia Postprocedure Evaluation (Addendum)
Anesthesia Post Note  Patient: Kari MarionRebecca H Baker  Procedure(s) Performed: Procedure(s) (LRB): CESAREAN SECTION (N/A)  Anesthesia type: epidural  Patient location: PACU  Post pain: Pain level controlled  Post assessment: Post-op Vital signs reviewed  Last Vitals:  Filed Vitals:   08/27/14 1615  BP: 100/62  Pulse:   Temp: 36.8 C  Resp:     Post vital signs: Reviewed  Level of consciousness: awake  Complications: No apparent anesthesia complications

## 2014-08-27 NOTE — Progress Notes (Signed)
Alain MarionRebecca H Nile MRN: [id number]  Subjective: -Patient reports rectal pressure that is constant, but not unbearable.  Doula at bedside.   Objective: BP 127/78 mmHg  Pulse 130  Temp(Src) 97.8 F (36.6 C) (Axillary)  Resp 20  Ht 5' 10.5" (1.791 m)  Wt 224 lb (101.606 kg)  BMI 31.68 kg/m2  SpO2 100%     FHT: 135  bpm, Mod Var, -Decels, +Accels UC:Q 1-473min    SVE:   Dilation: 10 Station: 0, +1 Exam by:: Kutler Vanvranken Membranes: SROM at 0545 Pitocin:None  Assessment:  IUP at 41.2wks Cat I FT  TOLAC Second Stage Labor  Plan: -Infant with caput at +2 station, but otherwise infant 0,+1 station -Practice pushes for 15 minutes with decent maternal efforts, but inadequate results -In an effort to decrease maternal exhaustion, patient to labor down x 1 hour -Position changes to facilitate fetal rotation and descent -Continue other mgmt as ordered  Fleda Pagel LYNN,MSN, CNM 08/27/2014, 10:38 AM

## 2014-08-27 NOTE — Anesthesia Procedure Notes (Signed)
Epidural Patient location during procedure: OB Start time: 08/27/2014 7:05 AM  Staffing Anesthesiologist: Roxanne Panek A. Performed by: anesthesiologist   Preanesthetic Checklist Completed: patient identified, site marked, surgical consent, pre-op evaluation, timeout performed, IV checked, risks and benefits discussed and monitors and equipment checked  Epidural Patient position: right lateral decubitus Prep: site prepped and draped and DuraPrep Patient monitoring: continuous pulse ox and blood pressure Approach: midline Location: L3-L4 Injection technique: LOR air  Needle:  Needle type: Tuohy  Needle gauge: 17 G Needle length: 9 cm and 9 Needle insertion depth: 7 cm Catheter type: closed end flexible Catheter size: 19 Gauge Catheter at skin depth: 12 cm Test dose: negative and Other  Assessment Events: blood not aspirated, injection not painful, no injection resistance, negative IV test and no paresthesia  Additional Notes Patient identified. Risks and benefits discussed including failed block, incomplete  Pain control, post dural puncture headache, nerve damage, paralysis, blood pressure Changes, nausea, vomiting, reactions to medications-both toxic and allergic and post Partum back pain. All questions were answered. Patient expressed understanding and wished to proceed. Sterile technique was used throughout procedure. Epidural site was Dressed with sterile barrier dressing. No paresthesias, signs of intravascular injection Or signs of intrathecal spread were encountered.  Patient was more comfortable after the epidural was dosed. Please see RN's note for documentation of vital signs and FHR which are stable.

## 2014-08-27 NOTE — Addendum Note (Signed)
Addendum  created 08/27/14 1629 by Brayton CavesFreeman Karley Pho, MD   Modules edited: Notes Section   Notes Section:  File: [id number]

## 2014-08-27 NOTE — Lactation Note (Signed)
This note was copied from the chart of Kari Baker. Lactation Consultation Note  Patient Name: Kari Baker MWUXL'KToday's Date: 08/27/2014 Reason for consult: Initial assessment;Other (Comment) (LGA weighing 10 pounds 15 oz)  Mom is an experienced breastfeeding multipara.  She nursed her 33 yo for 2 years and her 33 yo for 22 months.  She denies hx of any breastfeeding problems.  Her newborn is LGA weighing >10 pounds and has been cluster feeding since delivery.  Mom says she knows how to hand express her milk as needed.  LC encouraged cue/cluster feedings and frequent STS. Mom encouraged to feed baby 8-12 times/24 hours and with feeding cues. LC encouraged review of Baby and Me pp 9, 14 and 20-25 for STS and BF information. LC provided [facility] Resource brochure and reviewed [facility] and list of community and web site resources.     Maternal Data Formula Feeding for Exclusion: No Has patient been taught Hand Expression?: Yes (mom states that she knows how to hand express her milk) Does the patient have breastfeeding experience prior to this delivery?: Yes  Feeding Feeding Type: Breast Fed  LATCH Score/Interventions Latch: Grasps breast easily, tongue down, lips flanged, rhythmical sucking.  Audible Swallowing: None Intervention(s): Skin to skin;Hand expression  Type of Nipple: Everted at rest and after stimulation  Comfort (Breast/Nipple): Soft / non-tender     Hold (Positioning): No assistance needed to correctly position infant at breast.  LATCH Score: 8  Lactation Tools Discussed/Used   STS, cue feedings, cluster feedings and supply and demand for maximum milk production Hand expression  Consult Status Consult Status: Follow-up Date: 08/28/14 Follow-up type: In-patient    Warrick ParisianBryant, Garcia Dalzell [facility] 08/27/2014, 10:23 PM

## 2014-08-27 NOTE — Transfer of Care (Signed)
Immediate Anesthesia Transfer of Care Note  Patient: Kari MarionRebecca H Slay  Procedure(s) Performed: Procedure(s): CESAREAN SECTION (N/A)  Patient Location: PACU  Anesthesia Type:epidural  Level of Consciousness: awake, alert  and oriented  Airway & Oxygen Therapy: Patient Spontanous Breathing  Post-op Assessment: Report given to PACU RN and Post -op Vital signs reviewed and stable  Post vital signs: Reviewed and stable  Complications: No apparent anesthesia complications

## 2014-08-27 NOTE — Progress Notes (Signed)
Pt requesting cesarean

## 2014-08-27 NOTE — Anesthesia Preprocedure Evaluation (Addendum)
Anesthesia Evaluation  Patient identified by MRN, date of birth, ID band Patient awake    Reviewed: Allergy & Precautions, H&P , NPO status , Patient's Chart, lab work & pertinent test results, Unable to perform ROS - Chart review only  Airway Mallampati: II  TM Distance: >3 FB Neck ROM: Full    Dental no notable dental hx. (+) Teeth Intact   Pulmonary neg pulmonary ROS,  breath sounds clear to auscultation  Pulmonary exam normal       Cardiovascular negative cardio ROS  Rhythm:Regular Rate:Normal     Neuro/Psych negative neurological ROS  negative psych ROS   GI/Hepatic Neg liver ROS, GERD-  Medicated and Controlled,  Endo/Other  negative endocrine ROSObesity    Renal/GU negative Renal ROS  negative genitourinary   Musculoskeletal negative musculoskeletal ROS (+)   Abdominal (+) + obese,   Peds  Hematology negative hematology ROS (+)   Anesthesia Other Findings   Reproductive/Obstetrics (+) Pregnancy (failure to descend, h/o C/S x2, for repeat C/S) Previous C/Section x 2                            Anesthesia Physical Anesthesia Plan  ASA: II and emergent  Anesthesia Plan: Epidural   Post-op Pain Management:    Induction:   Airway Management Planned: Natural Airway  Additional Equipment:   Intra-op Plan:   Post-operative Plan:   Informed Consent: I have reviewed the patients History and Physical, chart, labs and discussed the procedure including the risks, benefits and alternatives for the proposed anesthesia with the patient or authorized representative who has indicated his/her understanding and acceptance.     Plan Discussed with: Anesthesiologist, Surgeon and CRNA  Anesthesia Plan Comments:        Anesthesia Quick Evaluation

## 2014-08-27 NOTE — Progress Notes (Signed)
Kari Baker  Postpartum Day 0 S/P Unscheduled Repeat C/S--Post Op 6 hrs LOS: 0  Subjective -Patient holding infant in bed.  Reports decrease in nausea/vomiting since dose of zofran about an hour ago.  Patient requesting to get out of bed and "get moving."  Expresses some regret about unsuccessful vaginal delivery.  Reassurances given. Reports intake of ice chips without issue.  Unsure of BCM and infant with receive no circumcision.    Objective  Filed Vitals:   08/27/14 1645 08/27/14 1715 08/27/14 1837 08/27/14 1948  BP: 112/70 112/56 116/79 104/60  Pulse: 89 85 95 77  Temp:  97.8 F (36.6 C) 96.2 F (35.7 C) 97.4 F (36.3 C)  TempSrc:   Axillary Axillary  Resp: 12 16 20 16   Height:      Weight:      SpO2: 98% 96% 97% 93%    HEMOGLOBIN  Date/Time Value Ref Range Status  08/28/2014 06:00 AM 11.7* 12.0 - 15.0 g/dL Final  16/10/960412/15/2015 54:0906:00 AM 12.9 12.0 - 15.0 g/dL Final   HCT  Date/Time Value Ref Range Status  08/28/2014 06:00 AM 34.4* 36.0 - 46.0 % Final  08/27/2014 06:00 AM 37.6 36.0 - 46.0 % Final   PLATELETS  Date/Time Value Ref Range Status  08/28/2014 06:00 AM 134* 150 - 400 K/uL Final  08/27/2014 06:00 AM 148* 150 - 400 K/uL Final    Physical Exam  Constitutional: She is oriented to person, place, and time. She appears well-developed and well-nourished. No distress.  Cardiovascular: Normal rate and regular rhythm.   Pulmonary/Chest: Effort normal and breath sounds normal.  Abdominal: Soft. Bowel sounds are normal. She exhibits no distension. There is tenderness.  Neurological: She is alert and oriented to person, place, and time.  Skin: Skin is warm and dry.  Foley catheter draining reddish brown urine Bolus infusing per doctors orders  Assessment Postpartum Day 0 S/P Unscheduled Repeat C/S 6 Hrs Post Op  Hemodynamically Stable Breastfeeding  Plan -Ambulate as tolerated -Advance diet as tolerated -Continue to monitor urine output -Continue other  mgmt as ordered  Tehran Rabenold LYNN, MSN, CNM 08/27/2014, 8:42 PM

## 2014-08-28 ENCOUNTER — Encounter (HOSPITAL_COMMUNITY): Payer: Self-pay | Admitting: Obstetrics and Gynecology

## 2014-08-28 LAB — CBC
HCT: 34.4 % — ABNORMAL LOW (ref 36.0–46.0)
Hemoglobin: 11.7 g/dL — ABNORMAL LOW (ref 12.0–15.0)
MCH: 29 pg (ref 26.0–34.0)
MCHC: 34 g/dL (ref 30.0–36.0)
MCV: 85.4 fL (ref 78.0–100.0)
PLATELETS: 134 10*3/uL — AB (ref 150–400)
RBC: 4.03 MIL/uL (ref 3.87–5.11)
RDW: 14.1 % (ref 11.5–15.5)
WBC: 15.7 10*3/uL — ABNORMAL HIGH (ref 4.0–10.5)

## 2014-08-28 MED ORDER — RHO D IMMUNE GLOBULIN 1500 UNIT/2ML IJ SOSY
300.0000 ug | PREFILLED_SYRINGE | Freq: Once | INTRAMUSCULAR | Status: AC
  Administered 2014-08-28: 300 ug via INTRAVENOUS
  Filled 2014-08-28: qty 2

## 2014-08-28 NOTE — Progress Notes (Signed)
Subjective: Postpartum Day 1: Cesarean Delivery due to CPD Patient up ad lib, reports no syncope or dizziness. Feeding:  breast Contraceptive plan:  unsure  Objective: Vital signs in last 24 hours: Temp:  [96.2 F (35.7 C)-98.4 F (36.9 C)] 98.4 F (36.9 C) (12/16 1100) Pulse Rate:  [70-135] 100 (12/16 1100) Resp:  [12-39] 18 (12/16 1100) BP: (87-124)/(45-93) 108/75 mmHg (12/16 1100) SpO2:  [93 %-100 %] 98 % (12/16 1100)  Physical Exam:  General: alert and cooperative Lochia: appropriate Uterine Fundus: firm Abdomen:  + bowel sounds, non distended Incision: healing well  Honeycomb dressing CDI DVT Evaluation: No evidence of DVT seen on physical exam. Homan's sign: Negative   Recent Labs  08/27/14 0600 08/28/14 0600  HGB 12.9 11.7*  HCT 37.6 34.4*  WBC 11.3* 15.7*    Assessment: Status post Cesarean section day 1. Doing well postoperatively.  Remove outer dressing Honeycomb dressing in place, no significant drainage Anemia - hemodynamicly stable.    Plan: Continue current care. Breastfeeding  Possible DC to morrow     Kari Baker, CNM, MSN 08/28/2014. 11:35 AM

## 2014-08-28 NOTE — Anesthesia Postprocedure Evaluation (Deleted)
  Anesthesia Post-op Note  Patient: Kari MarionRebecca H Wygant  Procedure(s) Performed: Procedure(s): CESAREAN SECTION (N/A)  Patient Location: Mother/Baby  Anesthesia Type:Epidural  Level of Consciousness: awake, alert , oriented and patient cooperative  Airway and Oxygen Therapy: Patient Spontanous Breathing  Post-op Pain: mild  Post-op Assessment: Patient's Cardiovascular Status Stable, Respiratory Function Stable, No signs of Nausea or vomiting, Pain level controlled, No headache, No backache, No residual numbness and No residual motor weakness  Post-op Vital Signs: stable  Last Vitals:  Filed Vitals:   08/28/14 0310  BP: 113/70  Pulse: 84  Temp: 36.5 C  Resp: 18    Complications: No apparent anesthesia complications

## 2014-08-28 NOTE — Addendum Note (Signed)
Addendum  created 08/28/14 0854 by Earmon PhoenixValerie P Yeilyn Gent, CRNA   Modules edited: Charges VN, Notes Section   Notes Section:  File: [id number]

## 2014-08-28 NOTE — Addendum Note (Signed)
Addendum  created 08/28/14 0856 by Earmon PhoenixValerie P Chanique Duca, CRNA   Modules edited: Notes Section   Notes Section:  Delete: [id number]; File: [id number]

## 2014-08-28 NOTE — Lactation Note (Signed)
This note was copied from the chart of Kari Baker Camper. Lactation Consultation Note  2 voids and 1 stool in the last 24 hours. Mother latched baby in cradle position. Sucks and some swallows viewed for 10 min. Mom encouraged to feed baby 8-12 times/24 hours and with feeding cues.        Patient Name: Kari Baker Lefever EAVWU'JToday's Date: 08/28/2014 Reason for consult: Follow-up assessment   Maternal Data    Feeding Feeding Type: Breast Fed Length of feed: 25 min  LATCH Score/Interventions Latch: Grasps breast easily, tongue down, lips flanged, rhythmical sucking. Intervention(s): Skin to skin;Waking techniques  Audible Swallowing: A few with stimulation  Type of Nipple: Everted at rest and after stimulation  Comfort (Breast/Nipple): Soft / non-tender     Hold (Positioning): Assistance needed to correctly position infant at breast and maintain latch.  LATCH Score: 8  Lactation Tools Discussed/Used     Consult Status Consult Status: Follow-up Date: 08/29/14 Follow-up type: In-patient    Dahlia ByesBerkelhammer, Donelle Baba [facility] 08/28/2014, 8:30 PM

## 2014-08-28 NOTE — Lactation Note (Signed)
This note was copied from the chart of Kari Sharol RousselRebecca Russett. Lactation Consultation Note  Voidx2 and no stools in the last 24 hours. Left LC phone number and suggest mother call to view next feeding. Mother states baby is difficult to calm when awake. Encouraged her to call for assistance.   Patient Name: Kari Sharol RousselRebecca Rooke ZOXWR'UToday's Date: 08/28/2014 Reason for consult: Follow-up assessment   Maternal Data    Feeding Feeding Type: Breast Fed Length of feed: 25 min  LATCH Score/Interventions                      Lactation Tools Discussed/Used     Consult Status Consult Status: Follow-up Date: 08/29/14 Follow-up type: In-patient    Dahlia ByesBerkelhammer, Ruth [facility] 08/28/2014, 7:47 PM

## 2014-08-28 NOTE — Anesthesia Postprocedure Evaluation (Signed)
  Anesthesia Post-op Note  Patient: Alain MarionRebecca H Weiland  Procedure(s) Performed: Procedure(s): CESAREAN SECTION (N/A)  Patient Location: Mother/Baby  Anesthesia Type:Epidural  Level of Consciousness: awake, alert , oriented and patient cooperative  Airway and Oxygen Therapy: Patient Spontanous Breathing  Post-op Pain: mild  Post-op Assessment: Patient's Cardiovascular Status Stable, Respiratory Function Stable, No headache, No backache, No residual numbness and No residual motor weakness  Post-op Vital Signs: stable  Last Vitals:  Filed Vitals:   08/28/14 0310  BP: 113/70  Pulse: 84  Temp: 36.5 C  Resp: 18    Complications: No apparent anesthesia complications

## 2014-08-29 LAB — RH IG WORKUP (INCLUDES ABO/RH)
ABO/RH(D): O NEG
Fetal Screen: NEGATIVE
GESTATIONAL AGE(WKS): 41.2
Unit division: 0

## 2014-08-29 MED ORDER — IBUPROFEN 600 MG PO TABS: 600.0000 mg | ORAL_TABLET | Freq: Four times a day (QID) | ORAL | Status: DC

## 2014-08-29 MED ORDER — OXYCODONE-ACETAMINOPHEN 5-325 MG PO TABS: 1.0000 | ORAL_TABLET | ORAL | Status: DC | PRN

## 2014-08-29 NOTE — Discharge Summary (Signed)
Cesarean Section Delivery Discharge Summary  Kari Baker  DOB:    [birth year] MRN:    [id number] CSN:    [id number]  Date of admission:                  08/27/14  Date of discharge:                   08/29/14  Procedures this admission:  Repeat LTCS due to FTD, LGA  Date of Delivery: 08/27/14  Newborn Data:  Live born female  Birth Weight: 10 lb 15 oz (4960 g) APGAR: 8, 9  Home with mother.  Circumcision Plan: No circ  History of Present Illness:  Ms. Kari Baker is a 33 y.o. female, G3P2101, who presents at 5852w2d weeks gestation. The [facility].    Her pregnancy has been complicated by:  Patient Active Problem List   Diagnosis Date Noted  . Normal labor 08/27/2014  . LGA (large for gestational age) fetus affecting management of mother 08/27/2014  . History of C-section x 2 08/27/2014  . Rh negative, maternal 08/27/2014  . Anxiety state 08/27/2014  . Cesarean delivery delivered 08/27/2014    Hospital Course--Unscheduled Cesarean:  Admitted 08/27/14 at 8 cm, with plans for VBAC. Negative GBS.  Utilized epidural for pain management.  Progressed to complete, with pushing x approx 3 hours and no descent, with caput noted, she was consented for cesarean, with Dr. Estanislado Pandyivard performing a repeat LTCS under epidural anesthesia, with delivery of a viable female, with weight and Apgars as listed below. Infant was in good condition and remained at the patient's bedside.  The patient was taken to recovery in good condition.  Patient planned to breast feed.  On post-op day 1, patient was doing well, tolerating a regular diet, with Hgb of 11.7, platelet count of 134, down from 148 at admission.  Throughout her stay, her physical exam was WNL, her incision was CDI, and her vital signs remained stable.  By post-op day 2, she was up ad lib, tolerating a regular diet, with good pain  control with po med, using Motrin only.  She desired early d/c, was deemed to have received the full benefit of her hospital stay, and was discharged home in stable condition.  Contraceptive choice was undecided at time to d/c.  Patient was having some issues with breastfeeding, with LCs working with patient.  Home with Rx for Motrin and Percocet (which patient elect to not fill).  Patient requests incision check in 2 weeks.  Feeding:  breast  Contraception:  Undecided  Discharge hemoglobin:  HEMOGLOBIN  Date Value Ref Range Status  08/28/2014 11.7* 12.0 - 15.0 g/dL Final  78/29/562112/15/2015 30.812.9 12.0 - 15.0 g/dL Final  65/78/469602/24/2011 7.6* 12.0 - 15.0 g/dL Final   HCT  Date Value Ref Range Status  08/28/2014 34.4* 36.0 - 46.0 % Final  08/27/2014 37.6 36.0 - 46.0 % Final  11/06/2009 22.9* 36.0 - 46.0 % Final   WBC  Date Value Ref Range Status  08/28/2014 15.7* 4.0 - 10.5 K/uL Final  08/27/2014 11.3* 4.0 - 10.5 K/uL Final  11/06/2009 12.7* 4.0 - 10.5 K/uL Final    Discharge Physical Exam:   General: alert Lochia: appropriate Uterine Fundus: firm Abdomen:  + bowel sounds, NT Incision: Honeycomb dressing CDI DVT Evaluation: No evidence of DVT seen on physical exam. Negative Homan's sign.  Intrapartum Procedures: cesarean:  low cervical, transverse Postpartum Procedures: Rho(D) Ig Complications-Operative and Postpartum: none  Discharge Diagnoses: Term Pregnancy-delivered and failure to descend, previous cesarean, failed VBAC  Discharge Information:  Activity:           pelvic rest Diet:                routine Medications: Ibuprofen and Percocet Condition:      stable Instructions:  Discharge to: home  Follow-up Information    [facility]. Schedule an appointment as soon as possible for a visit in 2 weeks.   Specialty:  Obstetrics and Gynecology   Why:  Call for 2 week appt for incision check.  Call for any other questions or  concerns.   Contact information:   [address] [place] [phone]      Nigel BridgemanLATHAM, Tere Mcconaughey [facility] 08/29/2014 8:48 AM

## 2014-08-29 NOTE — Discharge Instructions (Signed)

## 2014-08-31 LAB — TYPE AND SCREEN
ABO/RH(D): O NEG
ANTIBODY SCREEN: POSITIVE
DAT, IGG: NEGATIVE
Unit division: 0
Unit division: 0

## 2014-09-04 ENCOUNTER — Encounter (HOSPITAL_COMMUNITY): Payer: Self-pay | Admitting: *Deleted

## 2014-12-04 ENCOUNTER — Ambulatory Visit (HOSPITAL_COMMUNITY)
Admission: RE | Admit: 2014-12-04 | Discharge: 2014-12-04 | Disposition: A | Discharge status: Home / Self Care | Payer: BLUE CROSS/BLUE SHIELD | Source: Ambulatory Visit | Attending: Obstetrics and Gynecology | Admitting: Obstetrics and Gynecology

## 2014-12-04 NOTE — Lactation Note (Addendum)
Lactation Consult  Mother's reason for visit:  Low weight gain  Visit Type:  Feeding assessment , slow weight gain  Appointment Notes:  Baby is 64 months old and losing weight . Per mom using a Nipple shield. She bought at 1 week  Herself at 1 week out due to a poor latch. LC suggested at phone call after 4-6 feedings to post pump a day. And give back to baby. Wake baby up every 2 hours days and every 3 hours at night . And come in scheduled O/P appt. - confirmed. Consult:  Initial Lactation Consultant:  Kathrin Greathouse  ________________________________________________________________________ Baby's Name: Kari Baker Date of Birth: 08/27/2014 Pediatrician: Kari Baker  Gender: female Gestational Age: [redacted]w[redacted]d (At Birth) Birth Weight: 10 lb 15 oz (4960 g) Weight at Discharge:  Weight: (!) 10 lb 3.5 oz (4635 g) Date of Discharge: 08/29/2014 Filed Weights   08/27/14 1425 08/27/14 2331 08/28/14 2350  Weight: 10 lb 15 oz (4960 g) 10 lb 11.6 oz (4865 g) 10 lb 3.5 oz (4635 g)   [facility]:10 -11 oz 3 /21  Location:Pediatrician's office Weight today: 10 -11.0 oz, 4846 g       ________________________________________________________________________  Mother's Name: Kari Baker Type of delivery:  C/section  Breastfeeding Experience:  1st baby breastfeed  16 months , 2nd baby started out in NICU , and mom continue to breast feed for 2 years  Maternal Medical Conditions: none   ________________________________________________________________________  Breastfeeding History (Post Discharge)- Per mom milk came in 48 hours after DOB , and had no engorgement , just fed the baby  The weight was slow to come up so with in the 1st week of Kari Baker life it was suggested to me to get a nipple shield by DR. Puzio  Which I did and have been using it ever since. Milk is noted in the nipple shield when the baby feeds.  Baby feeds about every 2 hours or more. Usually stretches feeding at night to 3-4 hours at night.  Per mom had been had tried a bottle , baby didn't like it. Also per  Mom  had seen Kari Baker and he was aware of the low weight gain and aware she would be seeing lactation this week.   Frequency of breastfeeding:  Per mom every 2 hours and more  Duration of feeding:  - 10 mins per side   Supplementing: per mom none today , per mom has tried a bottle but [employer] like it.  Per mom -prefers keeping the baby at the breast and not using a bottle . LC recommended using the SNS at the breast ( per mom familiar with the SNS form her 2nd baby )  Mom even brought her own starter SNS.   Pumping - per mom has a DEBP Medela , but hasn't pumped since the baby was born until once today - Per mom no milk expressed.   Infant Intake and Output Assessment  Voids:  6  in 24 hrs.  Color:  Clear yellow Stools:  2  in 24 hrs.  Color:  Brown and tan   ________________________________________________________________________  Maternal Breast Assessment- Lc assessed breast tissue with moms permission , breast soft bilaterally , no plugs noted, nipples bilaterally healthy pink.  Breast:  Soft Nipple:  Erect Pain level:  0 Pain interventions:  Expressed breast milk Baby had a saturated wet diaper after feedings  _______________________________________________________________________ Feeding Assessment/Evaluation - Baby alert ,  calm and smiling when talked to, Baby appeared significantly under weight for his age of 3 months plus. Long and lanky , and pale in color   Initial feeding assessment:  Infant's oral assessment:  Variance see note below - frenulum issue   Positioning:  Cross cradle Left breast  LATCH documentation: attempted to latch without the nipple shield and the baby wouldn't latch .   Latch:  2 = Grasps breast easily, tongue down, lips flanged, rhythmical sucking.  Audible swallowing:  1  = A few with stimulation  Type of nipple:  2 = Everted at rest and after stimulation  Comfort (Breast/Nipple):  2 = Soft / non-tender  Hold (Positioning):  2 = No assistance needed to correctly position infant at breast  LATCH score:  9   Attached assessment:  Shallow at 1st , easily adjusted   Lips flanged:  Yes.    Lips untucked:  Yes.    Suck assessment:  Nutritive- non - nutritive   Tools:  Nipple shield 24 mm Instructed on use and cleaning of tool:  No. ( mom aware )   Pre-feed weight:  4846 g , 10-11.0 oz  Post-feed weight:  4868 g , 10 .11.7 oz  Amount transferred:  22 ml  Amount supplemented:  2nd feeding   Additional Feeding Assessment -   Infant's oral assessment:  Variance- short labial frenulum , able to stretch upper lip well , ( above gum line )                                             Noted a short anterior frenulum ( which probably was a major contributing factor to why moms milk supply is low.                                         Positioning:  Cross cradle Right breast  LATCH documentation:  Latch:  2 = Grasps breast easily, tongue down, lips flanged, rhythmical sucking.  Audible swallowing:  2 = Spontaneous and intermittent  Type of nipple:  2 = Everted at rest and after stimulation  Comfort (Breast/Nipple):  2 = Soft / non-tender  Hold (Positioning):  1 = Assistance needed to correctly position infant at breast and maintain latch  LATCH score:  9  Attached assessment:  Deep ( better depth than the left breast )   Lips flanged:  Yes.    Lips untucked:  Yes.    Suck assessment:  Nutritive  Tools:  Nipple shield 24 mm and Supplemental nutrition system Instructed on use and cleaning of tool:  Yes.    Pre-feed weight:  4868 g , 10-11.7 oz  Post-feed weight:  4906 g , 10-13.0 oz  Amount transferred:  38 ml (  Amount supplemented: 28 ml of 38 ml was formula in the SNS     Total amount pumped post feed:  Did not post pump   Total amount  transferred:  32  ml Total supplement given:    36 ml  Total Volume for feeding : 68 ml ( baby tolerated well ) just a very small amount of spit up.   Mom teary eyed during the consult , mom expressed feeling of disappointment in herself for not coming in to see Lactation  Consultant  In the 1st few weeks when she was having problems. Mom also expressed feeling of desiring to continue to breastfeed and increase milk supply. Mom aware her milk supply has gone down and has been challenged compared to her 1st 2 babies.   Lactation Plan of Care: Mom - Drink to Thirst , plenty water, nutritious snacks and meals  Feedings - every 2-3 hours and with feeding cues. Wake Kari Baker at night for feeding until gaining well  Latch with Double SNS , tubing under the nipple shield, ( can add both tubes under the NS for more volume as the baby is tolerating increased volume )  Prime the Nipple Shield before latching  IN SNS , put 60 ml of Formula to start of formula  Important - Kari Baker has to get used the increased volume- therefore he isn't going to probably take 60 ml at one feeding at 1st , it will increase over time  Goal - for Volume - would be 4 oz at a feeding plus over time ( To know Kari Baker is tolerating feedings is not spitty it up - it will be gradual - to gradually stretching is stomach )  Allow for him to take intermittent breaks during feeding , ( similar to an adult putting their fork down )  Extra pumping to increase milk volume is important - after 5-6 feedings a day pump both breast for 10 -15 mins ( @ least 4 psot pumping at a minimum )  Mother's Love products - herbs for increasing milk supply - take as directed   LC stressed the importance of weekly weight checks at the [facility] at [facility] , either Monday Evenings at 7 pm or Tuesday 11 am .  If unable to make BFS[facility] for weight checks to call DR. Puzio 's office for Weight check only apt )  Weight checks are important until Kari Baker gaining weight up to where he  should be in months of age .  Consistent additional post pumping a must to increase milk supply , Herbs to  enhance milk supply ( per mom already taking Mother's Love )  Stressed if EBM not available to supplement - formula needs to be used . Consistent supplementing , increasing gradually as baby tolerates it is a must for weight gain goal!   Mom has proper technique for latching and obtaining depth at the breast and application of Nipple Shield. Is familiar with set up of SNS ( double ) - ( LC did review and demo at consult ).  F/U with LC as O/P will be through the [facility] for weight checks , or Dr. Odessa Fleming office and O/P LC opt . If needed.  This LC feel this Baby needs to have an "oral assessment " done by an Oral specialist to assess the frenulum issue !  Due to the fact he isn't able to extend is tongue normally .   Mom aware this report will be faxed via EPIC to Kari Baker with Plan of care to increase weight.

## 2014-12-06 ENCOUNTER — Ambulatory Visit (HOSPITAL_COMMUNITY): Admission: RE | Admit: 2014-12-06 | Payer: Self-pay | Source: Ambulatory Visit

## 2015-08-11 ENCOUNTER — Emergency Department (HOSPITAL_COMMUNITY)
Admission: EM | Admit: 2015-08-11 | Discharge: 2015-08-11 | Disposition: A | Discharge status: Home / Self Care | Payer: BLUE CROSS/BLUE SHIELD | Attending: Emergency Medicine | Admitting: Emergency Medicine

## 2015-08-11 ENCOUNTER — Encounter (HOSPITAL_COMMUNITY): Payer: Self-pay | Admitting: Emergency Medicine

## 2015-08-11 ENCOUNTER — Emergency Department (HOSPITAL_COMMUNITY): Payer: BLUE CROSS/BLUE SHIELD

## 2015-08-11 DIAGNOSIS — Y9389 Activity, other specified: Secondary | ICD-10-CM | POA: Diagnosis not present

## 2015-08-11 DIAGNOSIS — W208XXA Other cause of strike by thrown, projected or falling object, initial encounter: Secondary | ICD-10-CM | POA: Insufficient documentation

## 2015-08-11 DIAGNOSIS — S6010XA Contusion of unspecified finger with damage to nail, initial encounter: Secondary | ICD-10-CM

## 2015-08-11 DIAGNOSIS — Z79899 Other long term (current) drug therapy: Secondary | ICD-10-CM | POA: Insufficient documentation

## 2015-08-11 DIAGNOSIS — Y998 Other external cause status: Secondary | ICD-10-CM | POA: Diagnosis not present

## 2015-08-11 DIAGNOSIS — S6991XA Unspecified injury of right wrist, hand and finger(s), initial encounter: Secondary | ICD-10-CM | POA: Diagnosis present

## 2015-08-11 DIAGNOSIS — S60131A Contusion of right middle finger with damage to nail, initial encounter: Secondary | ICD-10-CM | POA: Insufficient documentation

## 2015-08-11 DIAGNOSIS — Y9289 Other specified places as the place of occurrence of the external cause: Secondary | ICD-10-CM | POA: Insufficient documentation

## 2015-08-11 MED ORDER — HYDROCODONE-ACETAMINOPHEN 5-325 MG PO TABS: 1.0000 | ORAL_TABLET | Freq: Four times a day (QID) | ORAL | Status: AC | PRN

## 2015-08-11 MED ORDER — LIDOCAINE HCL (PF) 1 % IJ SOLN
5.0000 mL | Freq: Once | INTRAMUSCULAR | Status: AC
  Administered 2015-08-11: 5 mL via INTRADERMAL
  Filled 2015-08-11: qty 5

## 2015-08-11 NOTE — Discharge Instructions (Signed)
Subungual Hematoma A subungual hematoma is a pocket of blood that collects under the fingernail or toenail. The pressure created by the blood under the nail can cause pain. CAUSES  A subungual hematoma occurs when an injury to the finger or toe causes a blood vessel beneath the nail to break. The injury can occur from a direct blow such as slamming a finger in a door. It can also occur from a repeated injury such as pressure on the foot in a shoe while running. A subungual hematoma is sometimes called runner's toe or tennis toe. SYMPTOMS   Blue or dark blue skin under the nail.  Pain or throbbing in the injured area. DIAGNOSIS  Your caregiver can determine whether you have a subungual hematoma based on your history and a physical exam. If your caregiver thinks you might have a broken (fractured) bone, X-rays may be taken. TREATMENT  Hematomas usually go away on their own over time. Your caregiver may make a hole in the nail to drain the blood. Draining the blood is painless and usually provides significant relief from pain and throbbing. The nail usually grows back normally after this procedure. In some cases, the nail may need to be removed. This is done if there is a cut under the nail that requires stitches (sutures). HOME CARE INSTRUCTIONS   Put ice on the injured area.  Put ice in a plastic bag.  Place a towel between your skin and the bag.  Leave the ice on for 15-20 minutes, 03-04 times a day for the first 1 to 2 days.  Elevate the injured area to help decrease pain and swelling.  If you were given a bandage, wear it for as long as directed by your caregiver.  If part of your nail falls off, trim the remaining nail gently. This prevents the nail from catching on something and causing further injury.  Only take over-the-counter or prescription medicines for pain, discomfort, or fever as directed by your caregiver. SEEK IMMEDIATE MEDICAL CARE IF:   You have redness or swelling  around the nail.  You have yellowish-white fluid (pus) coming from the nail.  Your pain is not controlled with medicine.  You have a fever. MAKE SURE YOU:  Understand these instructions.  Will watch your condition.  Will get help right away if you are not doing well or get worse.   This information is not intended to replace advice given to you by your health care provider. Make sure you discuss any questions you have with your health care provider.   Document Released: 08/27/2000 Document Revised: 11/22/2011 Document Reviewed: 01/15/2015 Elsevier Interactive Patient Education 2016 [employer] Inc.  

## 2015-08-11 NOTE — ED Notes (Signed)
Pt states that on Saturday she dropped a brick on her right middle finger.  Pt states that on Saturday she was able to bend her finger when making a fist but now unable to due to swelling.

## 2015-08-11 NOTE — ED Provider Notes (Signed)
CSN: [id number]     Arrival date & time 08/11/15  [id number] History   First MD Initiated Contact with Patient 08/11/15 [phone]     Chief Complaint  Patient presents with  . Finger Injury      The history is provided by the patient. No language interpreter was used.   Kari Baker is a 34 y.o. female who presents to the Emergency Department complaining of finger injury.  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 She reported immediate pain in the finger. Pain has worsened over the last 2 days and now she has significant pain with flexion of the digit. She denies any medical problems or previous similar injuries. She is right-handed.  History reviewed. No pertinent past medical history. Past Surgical History  Procedure Laterality Date  . Cesarean section N/A 08/27/2014    Procedure: CESAREAN SECTION;  Surgeon: Silverio LaySandra Rivard, MD;  Location: [facility];  Service: Obstetrics;  Laterality: N/A;   No family history on file. Social History  Substance Use Topics  . Smoking status: Never Smoker   . Smokeless tobacco: None  . Alcohol Use: No   OB History    Gravida Para Term Preterm AB TAB SAB Ectopic Multiple Living   3 3 2 1  0 0 0 0 0 1     Review of Systems  All other systems reviewed and are negative.     Allergies  Review of patient's allergies indicates no known allergies.  Home Medications   Prior to Admission medications   Medication Sig Start Date End Date Taking? Authorizing Provider  acetaminophen (TYLENOL) 500 MG tablet Take 500 mg by mouth every 6 (six) hours as needed for headache.    Historical Provider, MD  ibuprofen (ADVIL,MOTRIN) 600 MG tablet Take 1 tablet (600 mg total) by mouth every 6 (six) hours. 08/29/14   Nigel BridgemanVicki Latham, CNM  oxyCODONE-acetaminophen (PERCOCET/ROXICET) 5-325 MG per tablet Take 1 tablet by mouth every 4 (four) hours as needed (for pain scale less than 7). 08/29/14   Nigel BridgemanVicki Latham, CNM  Prenatal Vit-Fe Fumarate-FA (PRENATAL MULTIVITAMIN) TABS  tablet Take 1 tablet by mouth daily at 12 noon.    Historical Provider, MD   BP 118/89 mmHg  Pulse 85  Temp(Src) 98.1 F (36.7 C) (Oral)  Resp 17  Ht 5\' 10"  (1.778 m)  Wt 194 lb (87.998 kg)  BMI 27.84 kg/m2  SpO2 99%  LMP 07/16/2015 Physical Exam  Constitutional: She is oriented to person, place, and time. She appears well-developed and well-nourished.  HENT:  Head: Normocephalic and atraumatic.  Cardiovascular: Normal rate.   Pulmonary/Chest: Effort normal. No respiratory distress.  Musculoskeletal:  Right third digit with complete subungual hematoma. There is mild ecchymosis and swelling to the distal phalanx. Soft compartments. Significant tenderness to palpation over the pulp of the third digit. There is decreased range of motion at the DIP joint. 2+ radial pulses.  Neurological: She is alert and oriented to person, place, and time.  Skin: Skin is warm and dry.  Psychiatric: She has a normal mood and affect. Her behavior is normal.  Nursing note and vitals reviewed.   ED Course  Procedures   NERVE BLOCK Performed by: Tilden FossaElizabeth Brinkley Peet Consent: Verbal consent obtained. Required items: required blood products, implants, devices, and special equipment available Time out: Immediately prior to procedure a "time out" was called to verify the correct patient, procedure, equipment, support staff and site/side marked as required.  Indication: finger pain Nerve block body site: right middle finger  Preparation: Patient was prepped and draped in the usual sterile fashion. Needle gauge: 25 G Location technique: anatomical landmarks  Local anesthetic: 1%lidocaine  Anesthetic total: 3 ml  Outcome: pain improved Patient tolerance: Patient tolerated the procedure well with no immediate complications.  Nailbed trephination: Area was prepped with chlorhexidine.  18 gauge needle was applied with direct pressure to hematoma site on right third digit.  Small amount of blood was  expressed.  Procedure was repeated x 1.    Labs Review Labs Reviewed - No data to display  Imaging Review Dg Finger Middle Right  08/11/2015  CLINICAL DATA:  Crush injury to the right middle finger at the nail bed. Bruising. Initial encounter. EXAM: RIGHT MIDDLE FINGER 2+V COMPARISON:  None. FINDINGS: There is no evidence of fracture or dislocation. No opaque foreign body. IMPRESSION: Negative. Electronically Signed   By: Marnee Spring M.D.   On: 08/11/2015 08:07   I have personally reviewed and evaluated these images and lab results as part of my medical decision-making.   EKG Interpretation None      MDM   Final diagnoses:  Subungual hematoma of finger of right hand, initial encounter    Patient here for evaluation of finger pain following crush injury. She has a subungual hematoma, no examination findings concerning for compartment syndrome. Digital block was performed with nail bed trephination with small amount of blood return.  Discussed with patient and care for subungual hematoma, return precautions.no evidence of acute fracture.    Tilden Fossa, MD 08/11/15 [phone]

## 2017-01-31 IMAGING — CR DG FINGER MIDDLE 2+V*R*
3 series · 3 of 3 positions shown · non-contrast
Comparison: None.

CLINICAL DATA: Crush injury to the right middle finger at the nail
bed. Bruising. Initial encounter.

EXAM:
RIGHT MIDDLE FINGER 2+V

[x finger pa right]
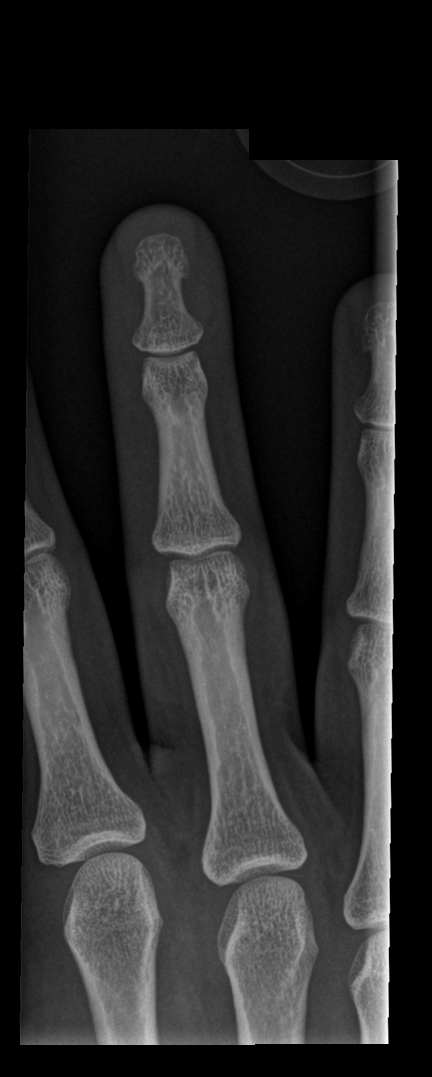

[x finger obl right]
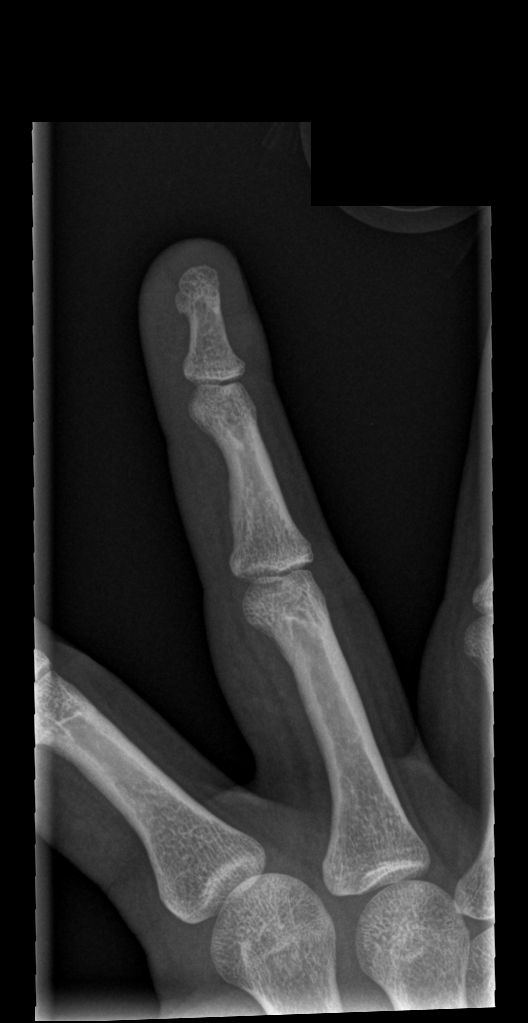

[x finger lat right]
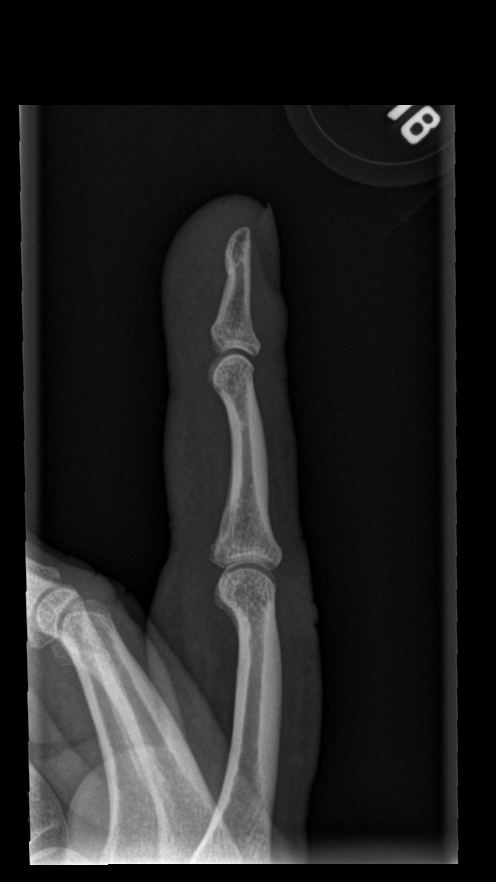

[3 of 3 positions shown; findings below may reference images not displayed]

FINDINGS: There is no evidence of fracture or dislocation. No opaque foreign
body.
IMPRESSION: Negative.

## 2022-07-04 ENCOUNTER — Emergency Department (HOSPITAL_COMMUNITY)
Admission: EM | Admit: 2022-07-04 | Discharge: 2022-07-04 | Discharge status: Against Medical Advice | Payer: BLUE CROSS/BLUE SHIELD
